# Patient Record
Sex: Female | Born: 1935 | Race: White | Hispanic: No | Smoking: Never smoker
Health system: Southern US, Community
[De-identification: ages and names within clinical notes are randomized; demographics above are authoritative.]

## PROBLEM LIST (undated history)

## (undated) DIAGNOSIS — G2581 Restless legs syndrome: Secondary | ICD-10-CM

## (undated) DIAGNOSIS — E785 Hyperlipidemia, unspecified: Secondary | ICD-10-CM

## (undated) DIAGNOSIS — E119 Type 2 diabetes mellitus without complications: Secondary | ICD-10-CM

## (undated) DIAGNOSIS — G629 Polyneuropathy, unspecified: Secondary | ICD-10-CM

## (undated) DIAGNOSIS — I10 Essential (primary) hypertension: Secondary | ICD-10-CM

---

## 2009-11-16 ENCOUNTER — Encounter (HOSPITAL_COMMUNITY): Admission: RE | Admit: 2009-11-16 | Discharge: 2009-12-16 | Payer: Self-pay | Admitting: Orthopaedic Surgery

## 2009-12-19 ENCOUNTER — Encounter (HOSPITAL_COMMUNITY)
Admission: RE | Admit: 2009-12-19 | Discharge: 2010-01-18 | Payer: Self-pay | Source: Home / Self Care | Admitting: Orthopaedic Surgery

## 2010-01-14 ENCOUNTER — Emergency Department (HOSPITAL_COMMUNITY): Admission: EM | Admit: 2010-01-14 | Discharge: 2010-01-14 | Payer: Self-pay | Admitting: Emergency Medicine

## 2010-11-06 LAB — GLUCOSE, CAPILLARY: Glucose-Capillary: 284 mg/dL — ABNORMAL HIGH (ref 70–99)

## 2010-11-28 IMAGING — CR DG ANKLE COMPLETE 3+V*L*
3 series · 3 of 3 positions shown · non-contrast
Comparison: Left foot series from the same day.

CLINICAL DATA: 74-year-old female with pain at the lateral foot and
ankle.  No known injury.

LEFT ANKLE COMPLETE - 3+ VIEW

[view not recorded (1 of 3)]
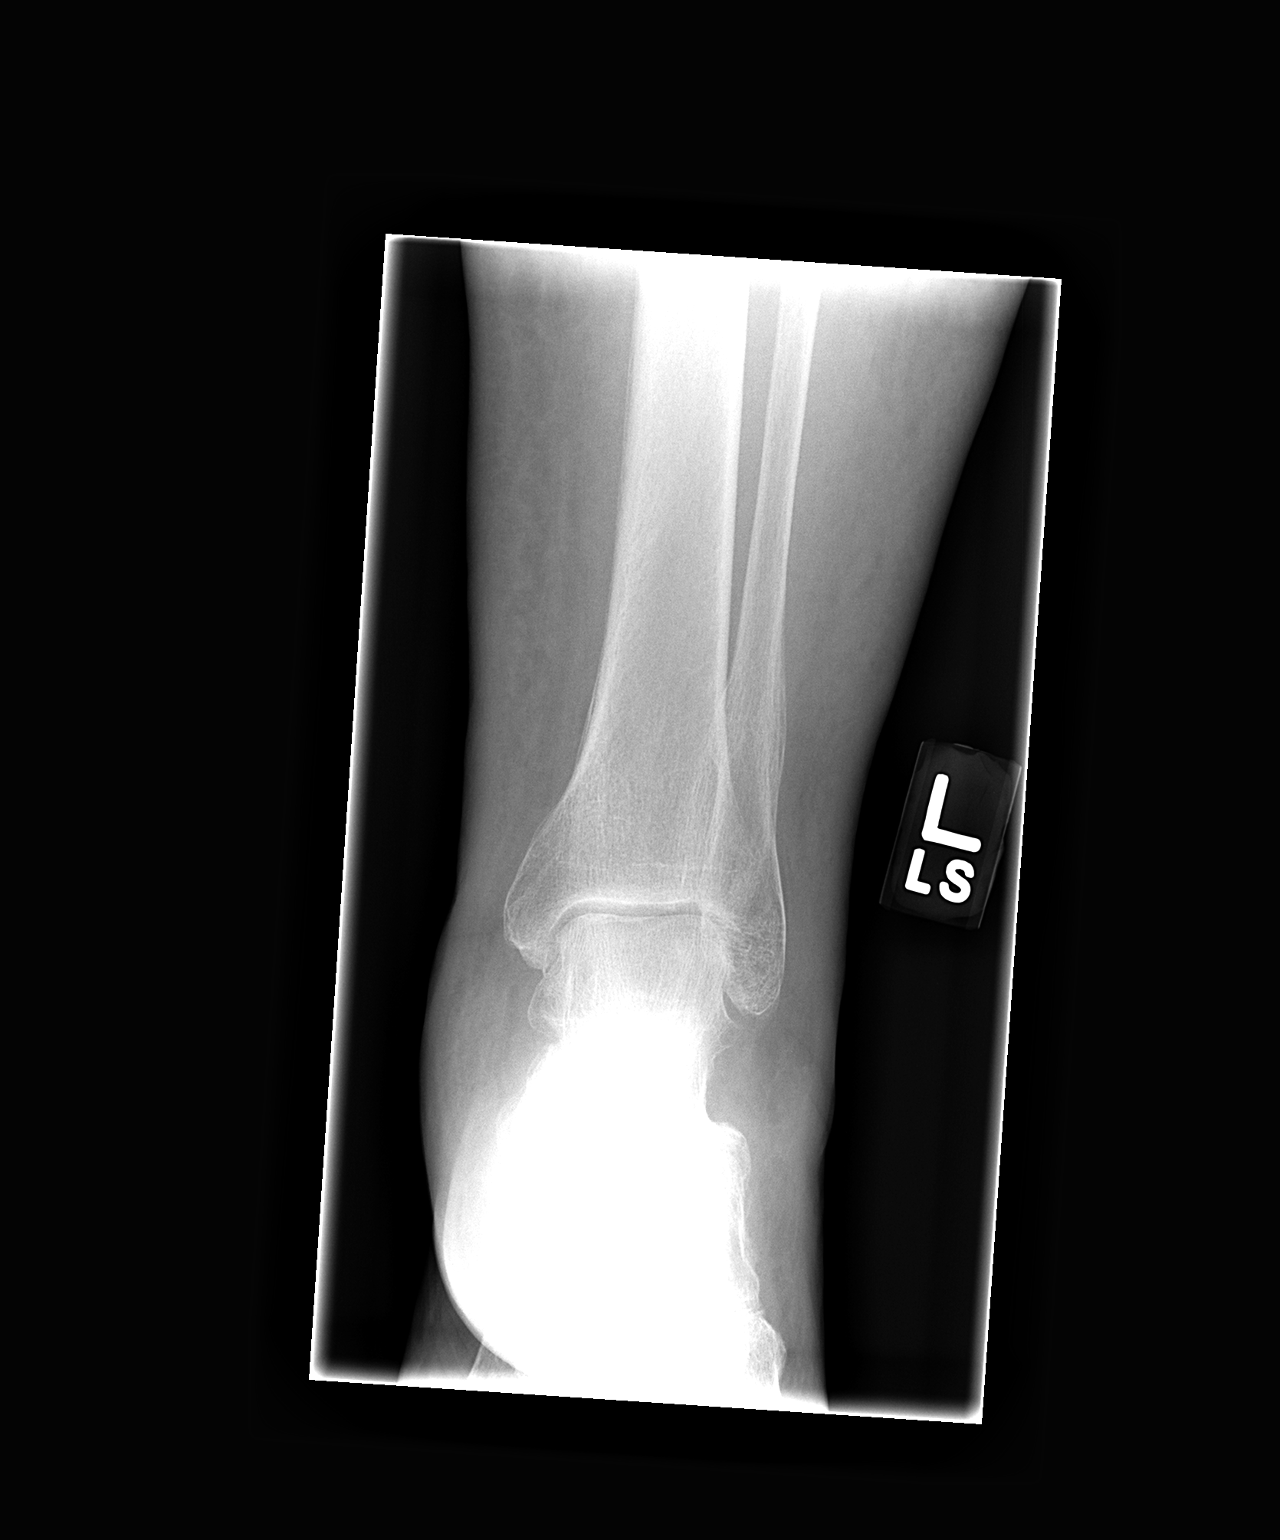

[view not recorded (2 of 3)]
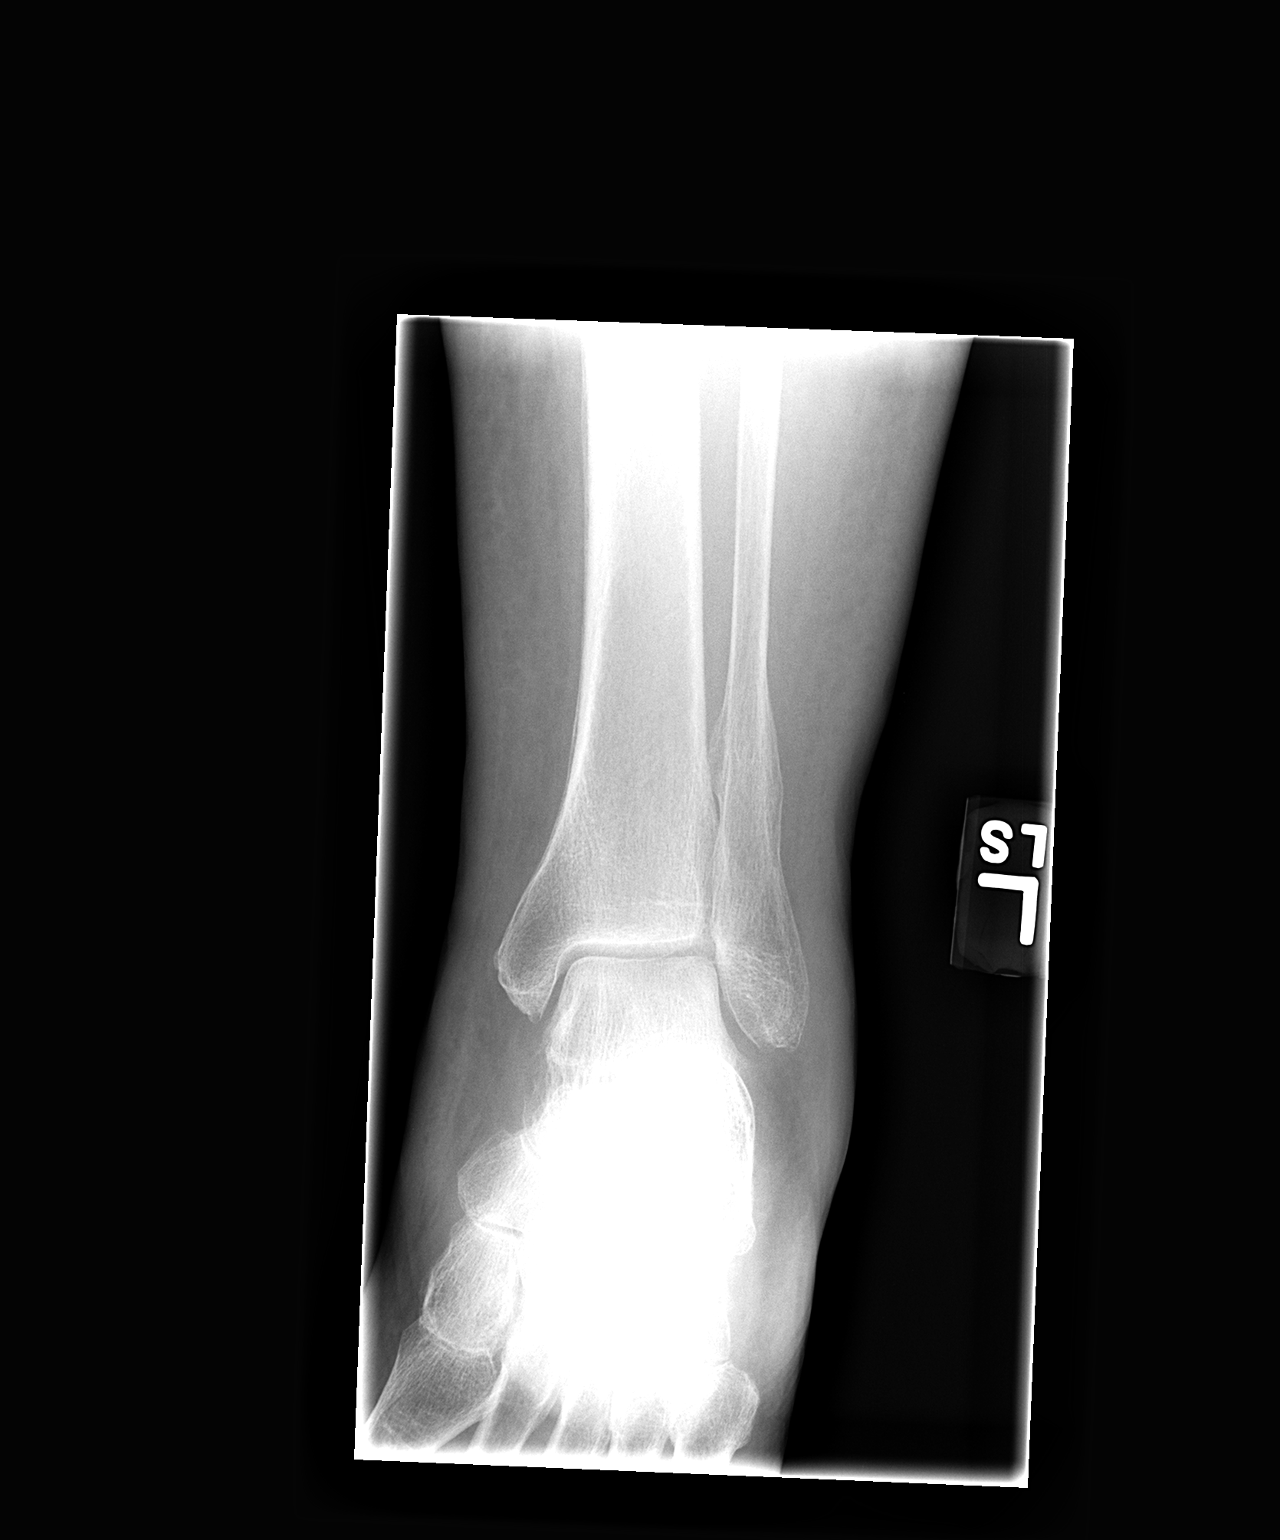

[view not recorded (3 of 3)]
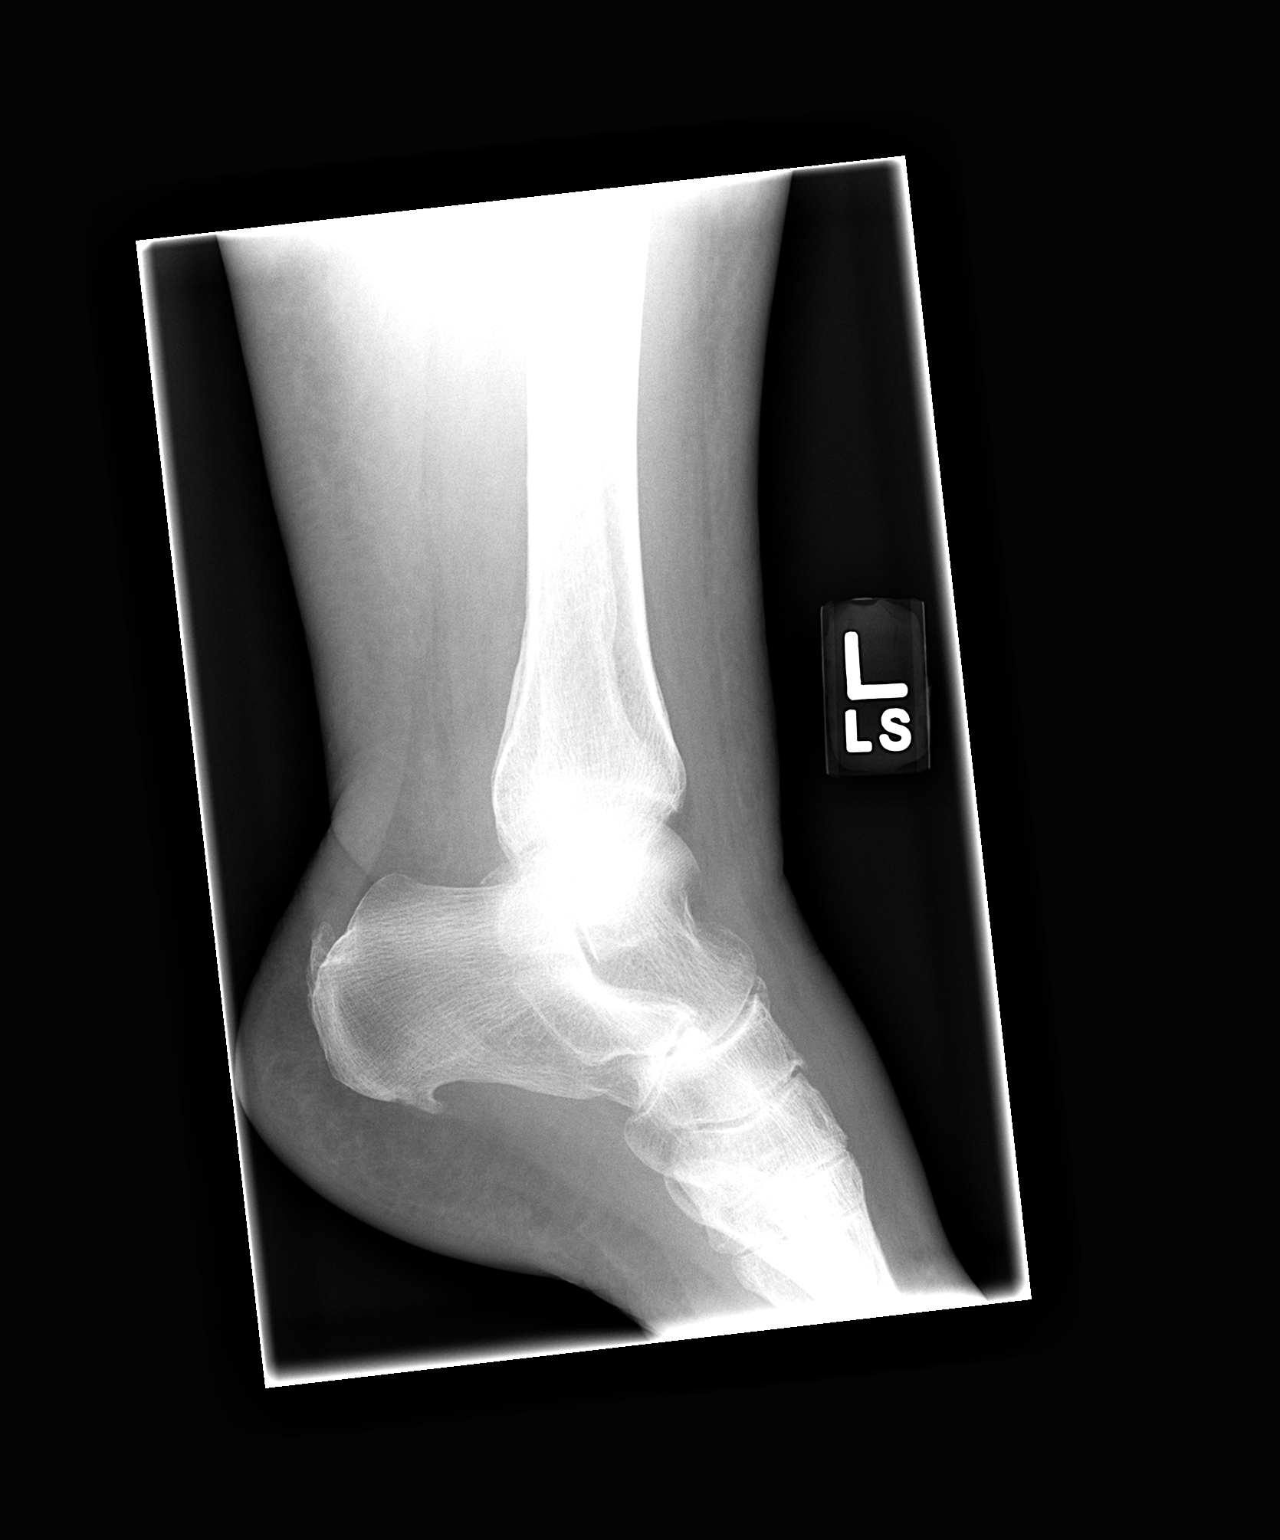

[3 of 3 positions shown; findings below may reference images not displayed]

FINDINGS: Osteopenia.  Healed distal left fibula deformity.  Normal
mortise joint alignment.  Talar dome intact.  There may be soft
tissue swelling about the ankle.  No joint effusion.
IMPRESSION: No acute osseous abnormality identified about the left ankle.

## 2010-11-28 IMAGING — CR DG FOOT COMPLETE 3+V*L*
3 series · 3 of 3 positions shown · non-contrast
Comparison: None.

CLINICAL DATA: 74-year-old female with pain at the lateral aspect
of the foot and ankle.  No known injury.

LEFT FOOT - COMPLETE 3+ VIEW

[view not recorded (1 of 3)]
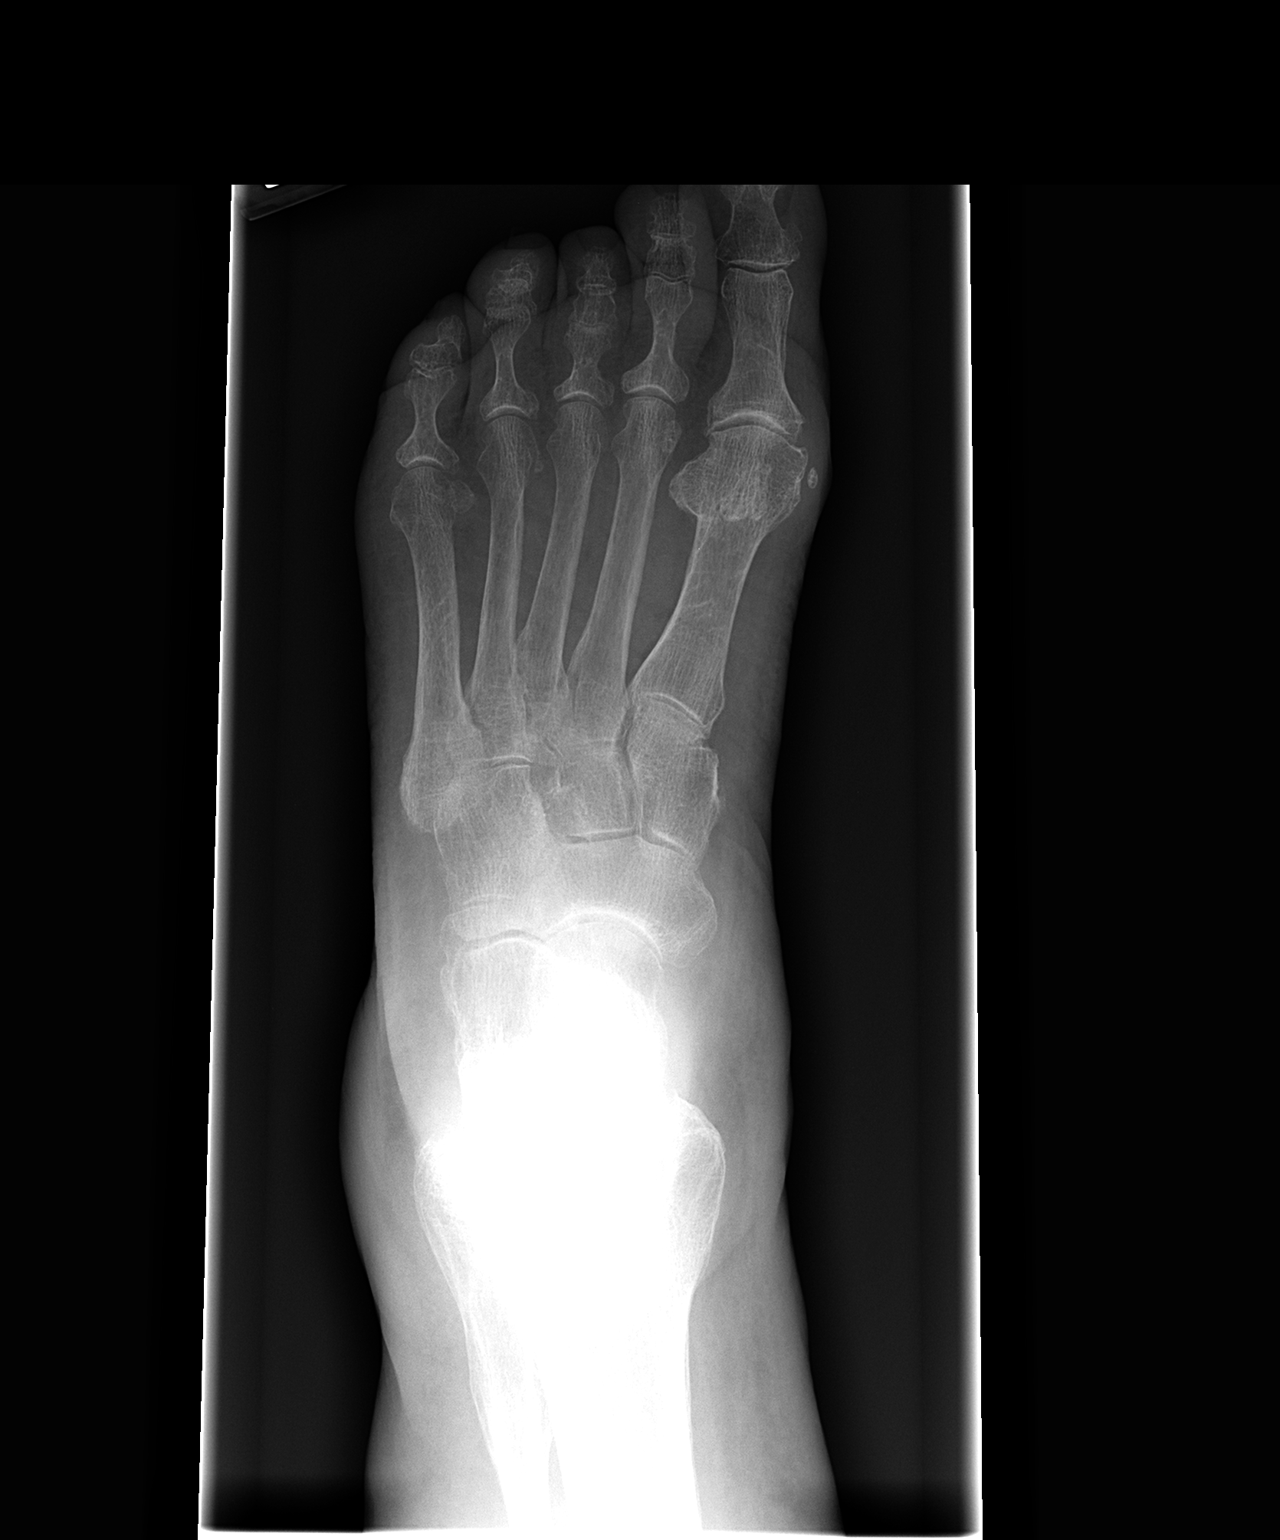

[view not recorded (2 of 3)]
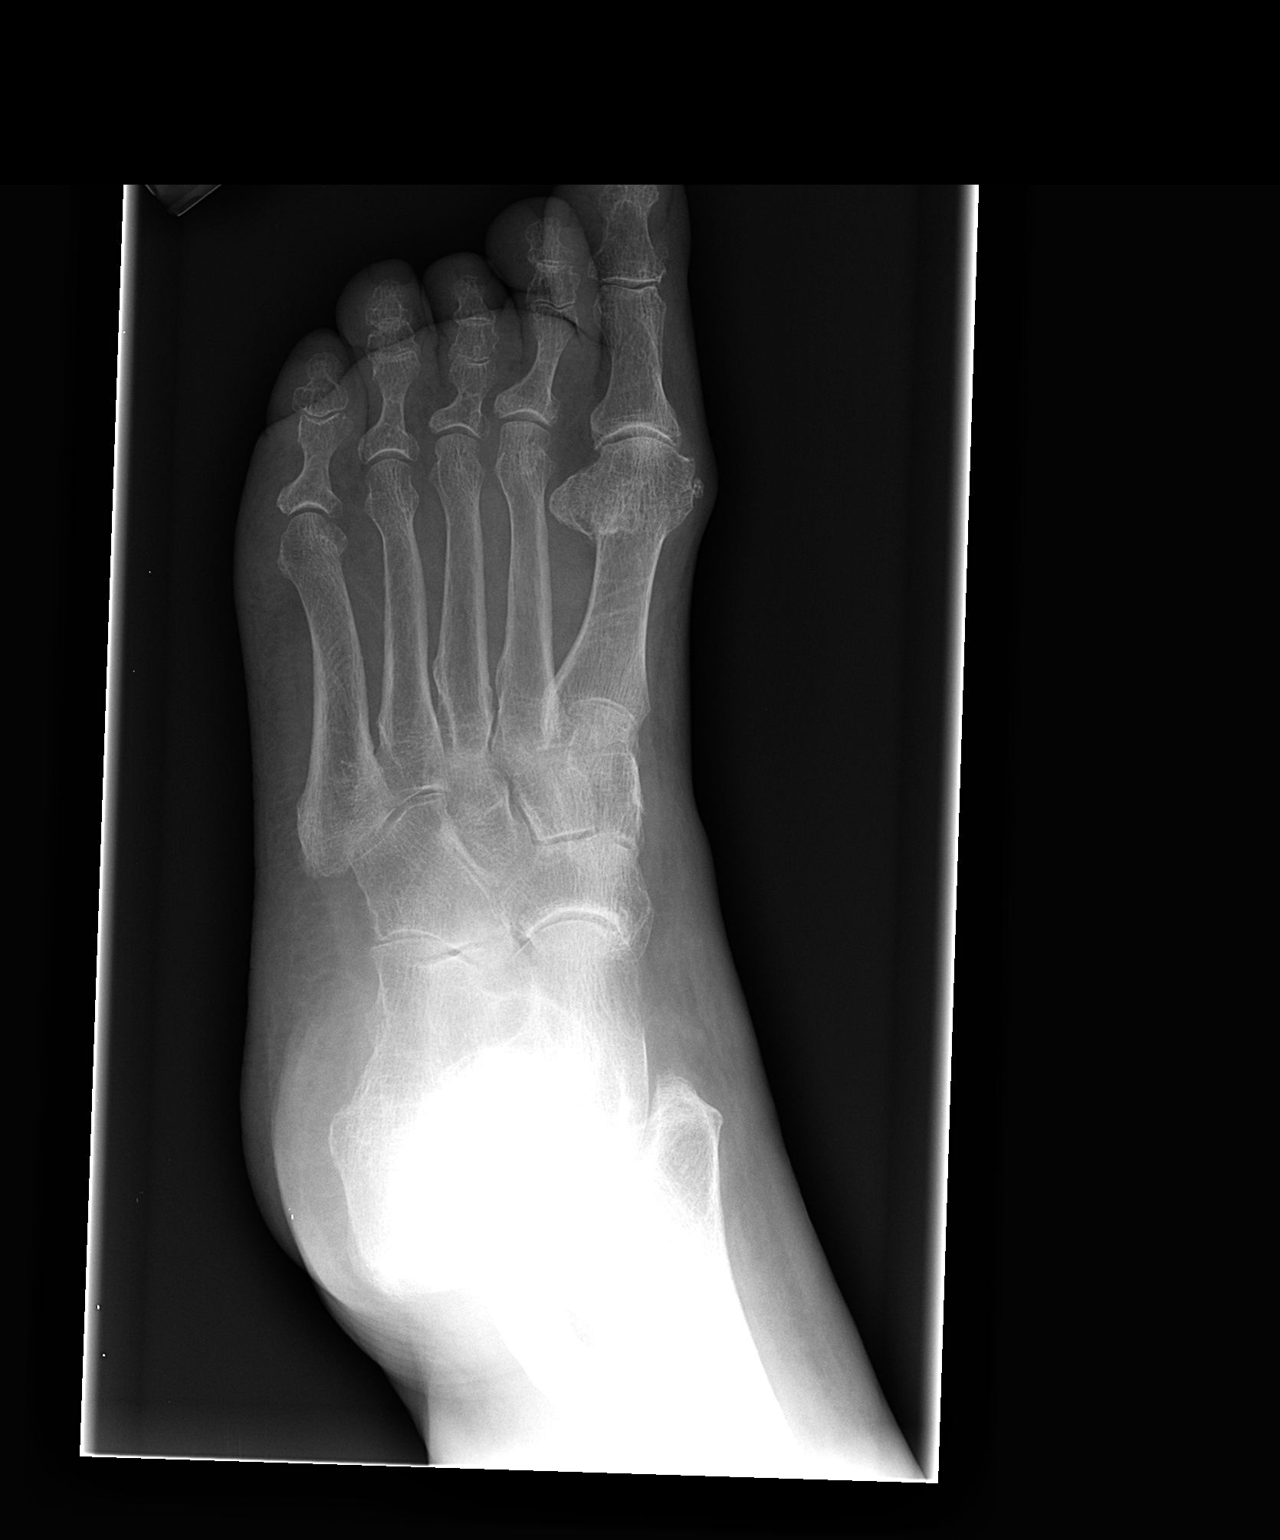

[view not recorded (3 of 3)]
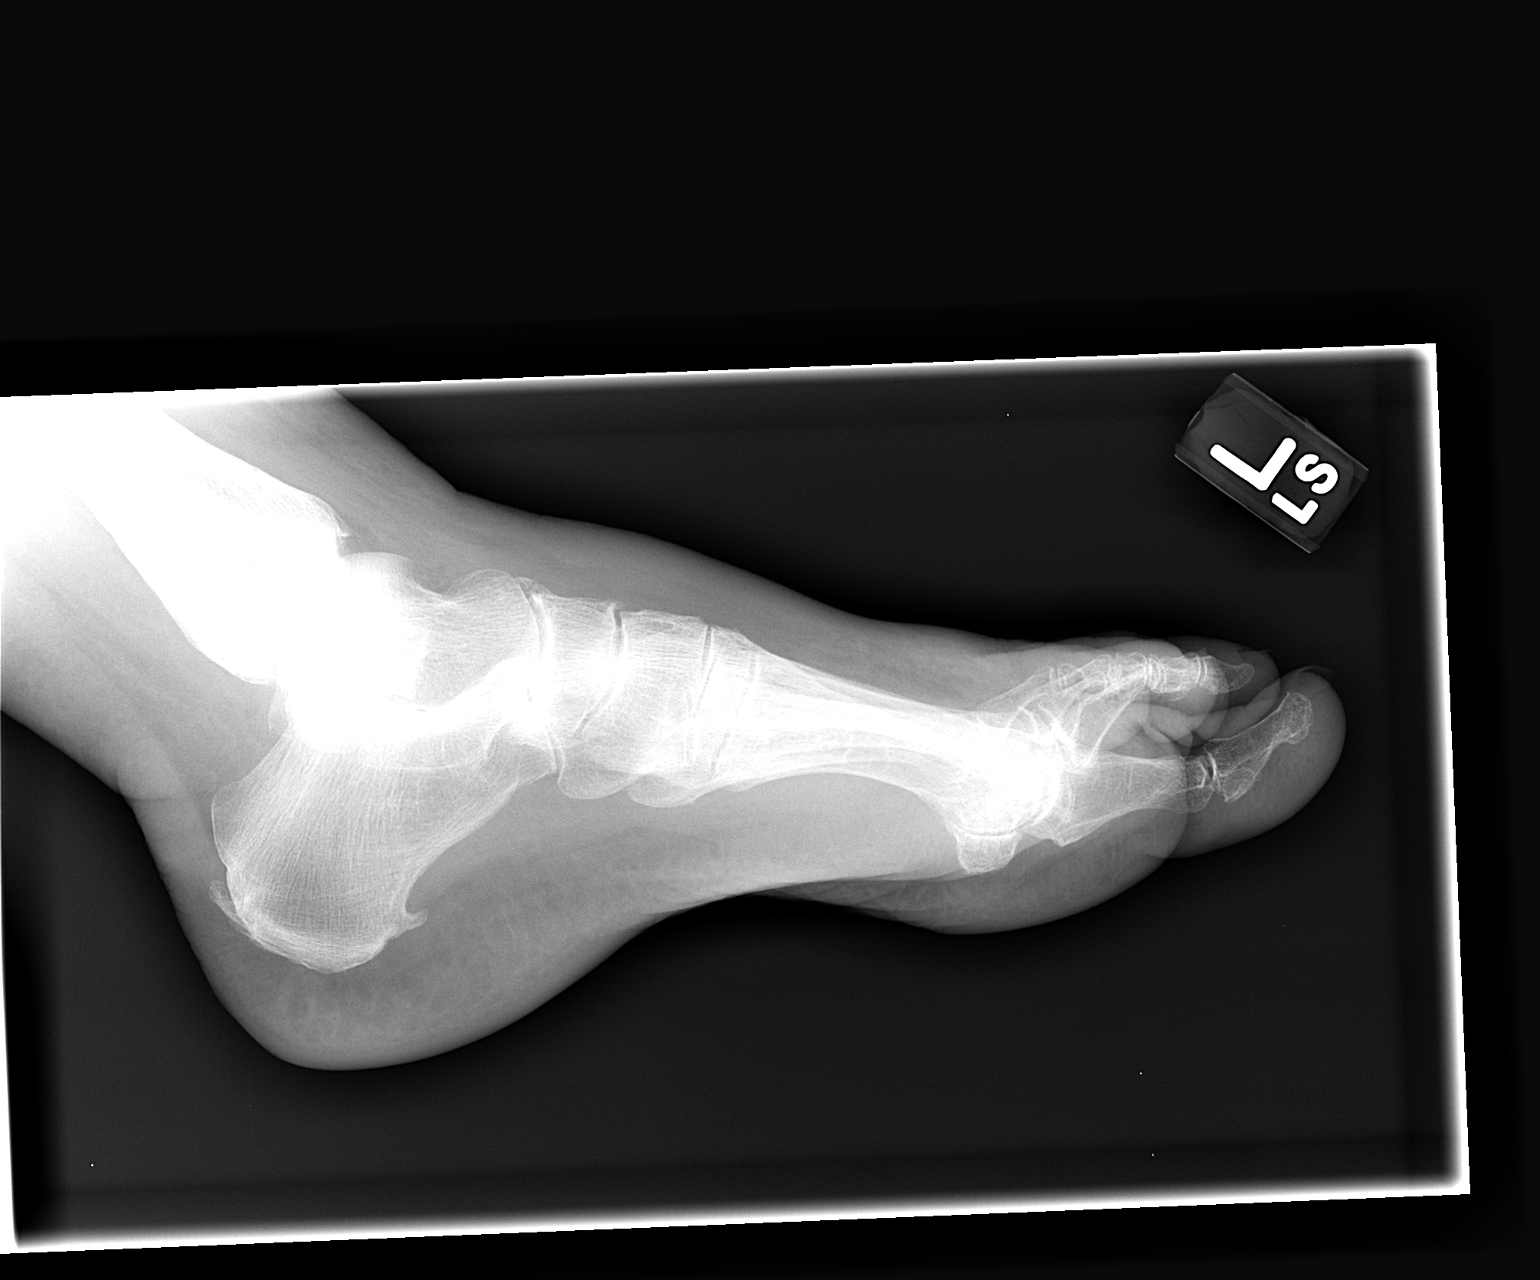

[3 of 3 positions shown; findings below may reference images not displayed]

FINDINGS: Osteopenia.  Calcaneus appears intact.  Joint spaces
appear within normal limits.  No osteolysis.  No acute fracture or
dislocation identified. Suggestion of a chronic distal left fibula
deformity.
IMPRESSION: Osteopenia. No acute osseous abnormality identified about the left
foot.

## 2011-10-01 ENCOUNTER — Ambulatory Visit (HOSPITAL_COMMUNITY)
Admission: RE | Admit: 2011-10-01 | Discharge: 2011-10-01 | Disposition: A | Discharge status: Home / Self Care | Payer: Medicare Other | Source: Ambulatory Visit | Attending: Orthopedic Surgery | Admitting: Orthopedic Surgery

## 2011-10-01 DIAGNOSIS — M25559 Pain in unspecified hip: Secondary | ICD-10-CM | POA: Insufficient documentation

## 2011-10-01 DIAGNOSIS — M25659 Stiffness of unspecified hip, not elsewhere classified: Secondary | ICD-10-CM | POA: Insufficient documentation

## 2011-10-01 DIAGNOSIS — M6281 Muscle weakness (generalized): Secondary | ICD-10-CM | POA: Insufficient documentation

## 2011-10-01 DIAGNOSIS — IMO0001 Reserved for inherently not codable concepts without codable children: Secondary | ICD-10-CM | POA: Insufficient documentation

## 2011-10-01 DIAGNOSIS — R29898 Other symptoms and signs involving the musculoskeletal system: Secondary | ICD-10-CM | POA: Insufficient documentation

## 2011-10-01 NOTE — Evaluation (Signed)
Physical Therapy Evaluation  Patient Details  Name: Emma Aguilar MRN: [id number] Date of Birth: June 28, 1936  Today's Date: 10/01/2011 Time: 0820-0930 Time Calculation (min): 70 min  Visit#: 1  of 12   Re-eval: 10/31/11 Assessment Diagnosis: Hip and knee pain Next MD Visit: not scheduled Prior Therapy: none  Past Medical History: No past medical history on file. Past Surgical History: No past surgical history on file.  Subjective Symptoms/Limitations Symptoms: Emma Aguilar states that the third week of January her rithe knee started bothering her.  She got an appointment with an orthopedic MD who gave her a cortisone shot that helped for a few days and then the pain returned .  She wend bath to the MD and her hip and knees were x-rayed and the MD stated that the pain was coming from her back.  She states that the pain is only in her Right hip and runs down into her knee now.  The radicular pain is intermittent.   How long can you sit comfortably?: The patient states that she is able to sit for half an hour and then she will need to stand up and move. How long can you stand comfortably?: The patient states that she is able to stand but it hurts.  She is able to stand for ten minutes before she starts having pain How long can you walk comfortably?: The patient states that she has pain immediately with walking and progresses the longer she walks. Repetition: Increases Symptoms Pain Assessment Currently in Pain?: Yes Pain Score:   6 Pain Location: Hip Pain Orientation: Right Pain Type: Chronic pain Pain Radiating Towards: Right knee Pain Onset: 1 to 4 weeks ago Pain Frequency: Intermittent Pain Relieving Factors: heat is temporary relief.   Precautions/Restrictions     Prior Functioning  Home Living Lives With: Alone Type of Home: House Home Layout: One level Home Access: Stairs to enter Entrance Stairs-Rails: Right Entrance Stairs-Number of Steps: 2 Prior  Function Driving: Yes Vocation: Retired Leisure: Hobbies-yes (Comment) Comments: bowling, dancing, shuffle board.  Cognition/Observation Cognition Overall Cognitive Status: Appears within functional limits for tasks assessed  Sensation/Coordination/Flexibility/Functional Tests    Assessment RLE Strength Right Hip Flexion: 4/5 Right Hip Extension: 3/5 Right Hip ABduction: 4/5 Right Hip ADduction: 3+/5 Right Knee Flexion: 3/5 Right Knee Extension: 4/5 Right Ankle Dorsiflexion: 4/5 LLE Strength Left Hip Flexion: 5/5 Left Hip Extension: 3/5 Left Hip ABduction: 4/5 Left Hip ADduction: 4/5 Left Knee Flexion: 5/5 Left Knee Extension: 5/5 Left Ankle Dorsiflexion: 4/5 Lumbar AROM Lumbar Flexion: decreased 20% with increase pain upon return Lumbar Extension: decreased 20% Lumbar - Right Side Bend: decreased 50% with increased pain Lumbar - Left Side Bend: decreased 70% Lumbar - Right Rotation: decreased 10% Lumbar - Left Rotation: decreased  20%  Exercise/Treatments   Stretches Active Hamstring Stretch: 3 reps;30 seconds Single Knee to Chest Stretch: 3 reps;30 seconds Standing Extension: 5 reps Lumbar Exercises   Stability Ab Set: 10 reps (with glut set)   Modalities Modalities: Ultrasound Ultrasound Ultrasound Location: R L4-S1 Ultrasound Parameters: 1.3 w/cm2 Lg head Ultrasound Goals: Pain  Physical Therapy Assessment and Plan PT Assessment and Plan Clinical Impression Statement: Pt with signs and symptoms of instability who will benefit from skilled PT to address pain and improve functional abliliy Rehab Potential: Good PT Frequency: Min 3X/week PT Duration: 4 weeks PT Treatment/Interventions: Therapeutic exercise (modalities as needed for pain) PT Plan: begin clam, bridge, bent knee raise and floating SLR next treatment    Goals Home  Exercise Program Pt will Perform Home Exercise Program: Independently PT Short Term Goals Time to Complete Short Term  Goals: 2 weeks PT Short Term Goal 1: Pain to only be going down to mid thigh level. PT Short Term Goal 2: Pain to be no greater than a 3/10 PT Short Term Goal 3: able to stand for 20 minutes before experiencing any pain PT Short Term Goal 4: Pt able to sit through church without pain PT Short Term Goal 5: Pt able to bowl without increased pain PT Long Term Goals Time to Complete Long Term Goals: 4 weeks PT Long Term Goal 1: I in Advance HEP PT Long Term Goal 2: No radicular pain Long Term Goal 3: Pain to be at a one at the most in the past week.  Problem List Patient Active Problem List  Diagnoses  . Right leg weakness    PT - End of Session Activity Tolerance: Patient tolerated treatment well General Behavior During Session: [facility] for tasks performed Cognition: [facility] for tasks performed PT Plan of Care PT Home Exercise Plan: given Consulted and Agree with Plan of Care: Patient   Aguilar,Emma 10/01/2011, 9:37 AM  Physician Documentation Your signature is required to indicate approval of the treatment plan as stated above.  Please sign and either send electronically or make a copy of this report for your files and return this physician signed original.   Please mark one 1.__approve of plan  2. ___approve of plan with the following conditions.   ______________________________                                                          _____________________ Physician Signature                                                                                                             Date

## 2011-10-02 ENCOUNTER — Ambulatory Visit (HOSPITAL_COMMUNITY)
Admission: RE | Admit: 2011-10-02 | Discharge: 2011-10-02 | Disposition: A | Discharge status: Home / Self Care | Payer: Medicare Other | Source: Ambulatory Visit | Attending: Orthopedic Surgery | Admitting: Orthopedic Surgery

## 2011-10-02 NOTE — Progress Notes (Signed)
Physical Therapy Treatment Patient Details  Name: Emma Aguilar MRN: [id number] Date of Birth: 04-24-1936  Today's Date: 10/02/2011 Time: 1308-6578 Time Calculation (min): 44 min Visit#: 2  of 12   Re-eval: 10/31/11 Charges: Therex x 32' [place] x 8'  Subjective: Symptoms/Limitations Symptoms: "I think the ultrasound heped." Pt reports HEP comliance. Pain Assessment Currently in Pain?: Yes Pain Score:   4 Pain Location: Hip Pain Orientation: Right Pain Radiating Towards: Right knee   Exercise/Treatments Stretches Active Hamstring Stretch: 3 reps;30 seconds Single Knee to Chest Stretch: 3 reps;30 seconds Stability Clam: 10 reps;Supine Bridge: 10 reps Ab Set: 10 reps;Limitations AB Set Limitations: with glute set Straight Leg Raise: 5 reps;Supine;Limitations Straight Leg Raises Limitations: floating  Modalities Modalities: Ultrasound Ultrasound Ultrasound Location: R L4-S1 Ultrasound Parameters: 1.3 w/cm2 x 8' Ultrasound Goals: Pain  Physical Therapy Assessment and Plan PT Assessment and Plan Clinical Impression Statement: Pt completes new stabilization exercises well with good core contraction. Pt states decreased pain to 3/10 at end of session. PT Plan: Continue to progress per PT POC.     Problem List Patient Active Problem List  Diagnoses  . Right leg weakness    PT - End of Session Activity Tolerance: Patient tolerated treatment well General Behavior During Session: [facility] for tasks performed Cognition: [facility] for tasks performed  Antonieta Iba 10/02/2011, 9:49 AM

## 2011-10-04 ENCOUNTER — Ambulatory Visit (HOSPITAL_COMMUNITY)
Admission: RE | Admit: 2011-10-04 | Discharge: 2011-10-04 | Disposition: A | Discharge status: Home / Self Care | Payer: Medicare Other | Source: Ambulatory Visit | Attending: Orthopedic Surgery | Admitting: Orthopedic Surgery

## 2011-10-04 NOTE — Progress Notes (Signed)
Physical Therapy Treatment Patient Details  Name: Emma Aguilar MRN: [id number] Date of Birth: 1935/08/22  Today's Date: 10/04/2011 Time: 7829-5621 Time Calculation (min): 36 min Visit#: 3  of 12   Re-eval: 10/31/11 Charges:  therex 26'  , ultrasound 8'    Subjective: Symptoms/Limitations Symptoms: Pt. states her pain was decreased a while after last visit.  Pt. reports her pain has now returned and is 4/10; states she rode in a car to [facility] and back and that irritated it. Pain Assessment Pain Score:   4 Pain Location: Hip Pain Orientation: Right Pain Radiating Towards: less pain radiating to R knee   Exercise/Treatments Stretches Active Hamstring Stretch: 3 reps;30 seconds Single Knee to Chest Stretch: 3 reps;30 seconds Stability Clam: 10 reps Bridge: 10 reps Bent Knee Raise: 10 reps Ab Set: 15 reps;5 seconds AB Set Limitations: with glute set Straight Leg Raise: 10 reps Straight Leg Raises Limitations: floating   Modalities Modalities: Ultrasound Ultrasound Ultrasound Location: R lumbar L4-S1 Ultrasound Parameters: 1.5 w/cm2 with 1 MHz X 8 minutes Ultrasound Goals: Pain  Physical Therapy Assessment and Plan PT Assessment and Plan Clinical Impression Statement: Added bent knee raise; Pt. displays good stab with therex and able to increase reps today without difficulty.  Pt. reports [place] helping to decrease pain. PT Plan: Continue to progress lumbar stab; progress to prone exercises when ready.     Problem List Patient Active Problem List  Diagnoses  . Right leg weakness    PT - End of Session Activity Tolerance: Patient tolerated treatment well General Behavior During Session: [facility] for tasks performed Cognition: [facility] for tasks performed  Chyrl Elwell B. Bascom Levels, PTA 10/04/2011, 9:12 AM

## 2011-10-08 ENCOUNTER — Ambulatory Visit (HOSPITAL_COMMUNITY)
Admission: RE | Admit: 2011-10-08 | Discharge: 2011-10-08 | Disposition: A | Discharge status: Home / Self Care | Payer: Medicare Other | Source: Ambulatory Visit | Attending: Orthopedic Surgery | Admitting: Orthopedic Surgery

## 2011-10-08 DIAGNOSIS — R29898 Other symptoms and signs involving the musculoskeletal system: Secondary | ICD-10-CM

## 2011-10-08 NOTE — Progress Notes (Signed)
Physical Therapy Treatment Patient Details  Name: Emma Aguilar MRN: [id number] Date of Birth: [birth year]  Today's Date: 10/08/2011 Time: 4098-1191 Time Calculation (min): 38 min Visit#: 4  of 12   Re-eval: 10/03/11    Subjective: Symptoms/Limitations Symptoms: Pt states that she had increased pain after going to church yesterday but it is back down to a 5/10 now.   Exercise/Treatments  Stretches Active Hamstring Stretch: 3 reps;30 seconds Single Knee to Chest Stretch: 3 reps;30 seconds Lumbar Exercises Scapular Retraction: Strengthening;Both;Standing;Theraband Theraband Level (Scapular Retraction): Level 4 (Blue) Row: Strengthening;Both;10 reps;Theraband Theraband Level (Row): Level 4 (Blue) Shoulder Extension: Strengthening;10 reps;Standing;Theraband Theraband Level (Shoulder Extension): Level 4 (Blue) Stability Isometric Hip Flexion: 10 reps Hip Abduction: 10 reps Machine Exercises   Modalities Modalities: Ultrasound Ultrasound Ultrasound Location: R lumbar hip  Ultrasound Parameters: 1.5 w/cm2 with 1 MHZ x 8 Ultrasound Goals: Pain  Physical Therapy Assessment and Plan PT Assessment and Plan Clinical Impression Statement: added T-band ex; isometric hip flex and sidelying abduction today. Pain down to a 2/10 after today rx. Rehab Potential: Good Clinical Impairments Affecting Rehab Potential: decreased stab ex. PT Frequency: Min 3X/week PT Duration: 4 weeks PT Treatment/Interventions: Therapeutic exercise PT Plan: begin prone ex next treatment.    Goals    Problem List Patient Active Problem List  Diagnoses  . Right leg weakness    PT - End of Session Activity Tolerance: Patient tolerated treatment well General Behavior During Session: [facility] for tasks performed Cognition: [facility] for tasks performed  Claudio Mondry,CINDY 10/08/2011, 12:38 PM

## 2011-10-10 ENCOUNTER — Ambulatory Visit (HOSPITAL_COMMUNITY)
Admission: RE | Admit: 2011-10-10 | Discharge: 2011-10-10 | Disposition: A | Discharge status: Home / Self Care | Payer: Medicare Other | Source: Ambulatory Visit | Attending: Orthopedic Surgery | Admitting: Orthopedic Surgery

## 2011-10-10 NOTE — Progress Notes (Signed)
Physical Therapy Treatment Patient Details  Name: Emma Aguilar MRN: [id number] Date of Birth: Mar 20, 1936  Today's Date: 10/10/2011 Time: 1191-4782 Time Calculation (min): 46 min Visit#: 5  of 12   Re-eval: 10/03/11 Charges: Therex x 33' [place] x 8'  Subjective: Symptoms/Limitations Symptoms: Pt states that the stretches and ultrasound really helped last time. Pain Assessment Currently in Pain?: Yes Pain Score:   2 Pain Location: Hip Pain Orientation: Right   Exercise/Treatments Stretches Active Hamstring Stretch: 3 reps;30 seconds Single Knee to Chest Stretch: 3 reps;30 seconds Lumbar Exercises Scapular Retraction: 15 reps Theraband Level (Scapular Retraction): Level 4 (Blue) Row: 15 reps Theraband Level (Row): Level 4 (Blue) Shoulder Extension: 15 reps Theraband Level (Shoulder Extension): Level 2 (Red) Stability Bridge: 10 reps Straight Leg Raise: 10 reps Hip Abduction: 10 reps  Modalities Modalities: Ultrasound Ultrasound Ultrasound Location: R lumbar hip Ultrasound Parameters: 1.5 w/cm2 with 1 MHZ x 8' Ultrasound Goals: Pain  Physical Therapy Assessment and Plan PT Assessment and Plan Clinical Impression Statement: Pt requires frequent manual cues to properly perform exercises. Pt presents with decreased tolerance for ePt presents with decreased tolerance for exercises this session. Pt refuses to complete isometric hip flexion secondary to weakness/pain. Pt also refuses prones exercises as pt cannot tolerate prone position. Pt is easily fatigued. Pt believes that her blood sugar may be up. Pt states that she will take her insulin when she gets home. Blood pressure was taken (129/76). Pt states pain decrease to .5/10 at end of session.xercises this session. Pt refuses to complete isometric hip flexion secondary to weakness/pain. Pt is easily fatigued. Pt believes that her blood sugar may be up. Pt states that she will take her insulin when she gets home. Blood  pressure was taken(129/76). Pt states pain decrease to .5/10 at end of session. PT Plan: Continue to progress per PT POC. Begin standing hip extension and functional squats next session.     Problem List Patient Active Problem List  Diagnoses  . Right leg weakness    PT - End of Session Activity Tolerance: Patient tolerated treatment well General Behavior During Session: [facility] for tasks performed Cognition: [facility] for tasks performed    Antonieta Iba 10/10/2011, 11:52 AM

## 2011-10-11 ENCOUNTER — Ambulatory Visit (HOSPITAL_COMMUNITY)
Admission: RE | Admit: 2011-10-11 | Discharge: 2011-10-11 | Disposition: A | Discharge status: Home / Self Care | Payer: Medicare Other | Source: Ambulatory Visit | Attending: Orthopedic Surgery | Admitting: Orthopedic Surgery

## 2011-10-11 NOTE — Progress Notes (Signed)
Physical Therapy Treatment Patient Details  Name: Emma Aguilar MRN: [id number] Date of Birth: 21-Jun-1936  Today's Date: 10/11/2011 Time: 7829-5621 Time Calculation (min): 44 min Visit#: 6  of 12   Re-eval: 10/31/11  Charge: therex 30 min [place] 8 min  Subjective: Symptoms/Limitations Symptoms: My hip is feeling better, min pain R hip 2/10. Pain Assessment Pain Score:   2 Pain Location: Hip Pain Orientation: Right  Objective:   Exercise/Treatments Stretches Active Hamstring Stretch: 3 reps;30 seconds;Limitations Active Hamstring Stretch Limitations: with rope Single Knee to Chest Stretch: 3 reps;30 seconds;Limitations Single Knee to Chest Stretch Limitations: with towel assistance Lumbar Exercises Scapular Retraction: 15 reps;Standing;Theraband Theraband Level (Scapular Retraction): Level 3 (Green) Row: 15 reps;Standing;Theraband Theraband Level (Row): Level 3 (Green) Shoulder Extension: 15 reps;Standing;Theraband Theraband Level (Shoulder Extension): Level 3 (Green) Stability Bridge: 10 reps Straight Leg Raise: 10 reps Hip Abduction: Side-lying;10 reps Leg Raise: Limitations;10 reps Leg Raises Limitations: standing hip extension Functional Squats: 10 reps  Modalities Modalities: Ultrasound Ultrasound Ultrasound Location: R lumbar hip Ultrasound Parameters: 1.5 w/cm2 with 1 MHZ x 8'  Ultrasound Goals: Pain  Physical Therapy Assessment and Plan PT Assessment and Plan Clinical Impression Statement: Added functional squats to POC for proper body mechanics, pt required demonstration and manual assistance for correct form.  Pt reported she feels good relief from the warmth of the [place] with pain reduced at end of session. PT Plan: Continue to progress per PT POC, resume supine clam for stability, progress to supine therex when ready.    Goals    Problem List Patient Active Problem List  Diagnoses  . Right leg weakness    PT - End of Session Activity Tolerance:  Patient tolerated treatment well General Behavior During Session: [facility] for tasks performed Cognition: [facility] for tasks performed  Juel Burrow, PTA  10/11/2011, 11:18 AM

## 2011-10-15 ENCOUNTER — Ambulatory Visit (HOSPITAL_COMMUNITY)
Admission: RE | Admit: 2011-10-15 | Discharge: 2011-10-15 | Disposition: A | Discharge status: Home / Self Care | Payer: Medicare Other | Source: Ambulatory Visit | Attending: Orthopedic Surgery | Admitting: Orthopedic Surgery

## 2011-10-15 NOTE — Progress Notes (Signed)
Physical Therapy Treatment Patient Details  Name: Emma Aguilar MRN: [id number] Date of Birth: Jun 08, 1936  Today's Date: 10/15/2011 Time: 4098-1191 Time Calculation (min): 41 min Visit#: 7  of 12   Re-eval: 10/31/11 Charges: Therex x 30' [place] x 8'  Subjective: Symptoms/Limitations Symptoms: I'm feeling better. I went dancing this weekend. but I took it easy. Pain Assessment Currently in Pain?: Yes Pain Score:  (.5/10) Pain Location: Hip Pain Orientation: Right   Exercise/Treatments Stretches Active Hamstring Stretch: 3 reps;30 seconds;Limitations Active Hamstring Stretch Limitations: with rope Single Knee to Chest Stretch: 3 reps;30 seconds;Limitations Single Knee to Chest Stretch Limitations: with towel assistance Lumbar Exercises Scapular Retraction: 15 reps;Standing;Theraband Theraband Level (Scapular Retraction): Level 3 (Green) Row: 15 reps;Standing;Theraband Theraband Level (Row): Level 3 (Green) Shoulder Extension: 15 reps;Standing;Theraband Theraband Level (Shoulder Extension): Level 3 (Green) Stability Clam: 10 reps;Supine Bridge: 10 reps Straight Leg Raise: 10 reps Leg Raise: Limitations;10 reps Leg Raises Limitations: standing hip extension Functional Squats: 10 reps  Modalities Modalities: Ultrasound Ultrasound Ultrasound Location: R posterior hip Ultrasound Parameters: 1.5 w/cm2 with 1 MHZ x 8' Ultrasound Goals: Pain  Physical Therapy Assessment and Plan PT Assessment and Plan Clinical Impression Statement: Pt's pain appears to be gradually decreasing. Pt reports HEP compliance. Pt completes most therex with minimal difficulty. Pt does continue to require multimodal cueing to correctly complete tband exercises. PT Plan: Continue to progress per PT POC.     Problem List Patient Active Problem List  Diagnoses  . Right leg weakness    PT - End of Session Activity Tolerance: Patient tolerated treatment well General Behavior During Session: [facility]  for tasks performed Cognition: [facility] for tasks performed   Seth Bake, PTA 10/15/2011, 12:14 PM

## 2011-10-17 ENCOUNTER — Ambulatory Visit (HOSPITAL_COMMUNITY)
Admission: RE | Admit: 2011-10-17 | Discharge: 2011-10-17 | Disposition: A | Discharge status: Home / Self Care | Payer: Medicare Other | Source: Ambulatory Visit | Attending: Orthopedic Surgery | Admitting: Orthopedic Surgery

## 2011-10-17 DIAGNOSIS — R29898 Other symptoms and signs involving the musculoskeletal system: Secondary | ICD-10-CM

## 2011-10-17 NOTE — Progress Notes (Signed)
Physical Therapy Treatment Patient Details  Name: Emma Aguilar MRN: [id number] Date of Birth: [birth year]  Today's Date: 10/17/2011 Time: 0930-1005 Time Calculation (min): 35 min Visit#: 8  of 12   Re-eval: 10/31/11 Charges:  therex 25'(all done with Becky Sax, PTA), ultrasound 8'    Subjective: Symptoms/Limitations Symptoms: Feeling good today, think the weather is the reason for any pain.  1/10 right hip. Pain Assessment Currently in Pain?: Yes Pain Score:   1 Pain Location: Hip Pain Orientation: Right   Exercise/Treatments  Therex instructed by Becky Sax, PTA Stretches Active Hamstring Stretch: 3 reps;30 seconds;Limitations Active Hamstring Stretch Limitations: with rope Single Knee to Chest Stretch: Limitations Single Knee to Chest Stretch Limitations: HEP Lumbar Exercises Scapular Retraction: 15 reps;Standing;Theraband Theraband Level (Scapular Retraction): Level 3 (Green);Other (comment) Row: 15 reps;Standing;Theraband Theraband Level (Row): Level 3 (Green) Shoulder Extension: 15 reps;Standing;Theraband Theraband Level (Shoulder Extension): Level 3 (Green) Stability Clam: Side-lying;15 reps Bridge: 15 reps Straight Leg Raise: 10 reps Hip Abduction: Side-lying;15 reps Leg Raise: Limitations Leg Raises Limitations: time Functional Squats: 15 reps;3 seconds   Modalities Modalities: Ultrasound Ultrasound Ultrasound Location: R lumbar/posterior hip in sidelying Ultrasound Parameters: 1.5w/cm2 with 1 MHz X 8 minutes  Physical Therapy Assessment and Plan PT Assessment and Plan Clinical Impression Statement: Pt required manual assistance for proper technique with tband for postural strengthening.  Pt c/o hamstring cramp during SLR, pain resolved following hamstring stretch.  MIn cueing required for stability with mat activities. PT Plan: Continue per POC     Problem List Patient Active Problem List  Diagnoses  . Right leg weakness    PT -  End of Session Activity Tolerance: Patient tolerated treatment well General Behavior During Session: [facility] for tasks performed Cognition: [facility] for tasks performed    Amy B. Bascom Levels, PTA 10/17/2011, 10:07 AM

## 2011-10-19 ENCOUNTER — Ambulatory Visit (HOSPITAL_COMMUNITY): Payer: Medicare Other | Admitting: Physical Therapy

## 2011-10-23 ENCOUNTER — Ambulatory Visit (HOSPITAL_COMMUNITY): Payer: Medicare Other | Admitting: Physical Therapy

## 2015-03-07 ENCOUNTER — Encounter (HOSPITAL_COMMUNITY): Payer: Self-pay | Admitting: Emergency Medicine

## 2015-03-07 ENCOUNTER — Emergency Department (HOSPITAL_COMMUNITY)
Admission: EM | Admit: 2015-03-07 | Discharge: 2015-03-07 | Discharge status: Against Medical Advice | Payer: Medicare Other | Attending: Emergency Medicine | Admitting: Emergency Medicine

## 2015-03-07 DIAGNOSIS — R109 Unspecified abdominal pain: Secondary | ICD-10-CM | POA: Diagnosis not present

## 2015-03-07 DIAGNOSIS — R531 Weakness: Secondary | ICD-10-CM | POA: Diagnosis not present

## 2015-03-07 DIAGNOSIS — I10 Essential (primary) hypertension: Secondary | ICD-10-CM | POA: Insufficient documentation

## 2015-03-07 DIAGNOSIS — E119 Type 2 diabetes mellitus without complications: Secondary | ICD-10-CM | POA: Insufficient documentation

## 2015-03-07 DIAGNOSIS — R110 Nausea: Secondary | ICD-10-CM | POA: Insufficient documentation

## 2015-03-07 DIAGNOSIS — R197 Diarrhea, unspecified: Secondary | ICD-10-CM | POA: Diagnosis not present

## 2015-03-07 HISTORY — DX: Hyperlipidemia, unspecified: E78.5

## 2015-03-07 HISTORY — DX: Polyneuropathy, unspecified: G62.9

## 2015-03-07 HISTORY — DX: Restless legs syndrome: G25.81

## 2015-03-07 HISTORY — DX: Essential (primary) hypertension: I10

## 2015-03-07 HISTORY — DX: Type 2 diabetes mellitus without complications: E11.9

## 2015-03-07 NOTE — ED Notes (Signed)
PT c/o diarrhea x1 week with some nausea at times. PT states no relief from home Lamictal and reports home antibiotics in the past 3 weeks for tx of pneumonia. PT c/o generlized weakness.

## 2016-05-20 DEATH — deceased
